# Patient Record
Sex: Male | Born: 1950 | Race: White | Hispanic: No | Marital: Married | Smoking: Never smoker
Health system: Southern US, Community
[De-identification: ages and names within clinical notes are randomized; demographics above are authoritative.]

## PROBLEM LIST (undated history)

## (undated) HISTORY — PX: EYE SURGERY: SHX253

---

## 2020-06-22 DIAGNOSIS — G20 Parkinson's disease: Secondary | ICD-10-CM

## 2020-06-22 HISTORY — DX: Parkinson's disease: G20

## 2020-09-28 ENCOUNTER — Encounter (HOSPITAL_COMMUNITY): Payer: Self-pay | Admitting: Emergency Medicine

## 2020-09-28 ENCOUNTER — Emergency Department (HOSPITAL_COMMUNITY)
Admission: EM | Admit: 2020-09-28 | Discharge: 2020-09-28 | Disposition: A | Discharge status: Home / Self Care | Payer: Medicare (Managed Care) | Attending: Emergency Medicine | Admitting: Emergency Medicine

## 2020-09-28 ENCOUNTER — Other Ambulatory Visit: Payer: Self-pay

## 2020-09-28 ENCOUNTER — Emergency Department (HOSPITAL_COMMUNITY): Payer: Medicare (Managed Care)

## 2020-09-28 DIAGNOSIS — R6883 Chills (without fever): Secondary | ICD-10-CM | POA: Insufficient documentation

## 2020-09-28 DIAGNOSIS — Z20822 Contact with and (suspected) exposure to covid-19: Secondary | ICD-10-CM | POA: Diagnosis not present

## 2020-09-28 DIAGNOSIS — G20 Parkinson's disease: Secondary | ICD-10-CM | POA: Insufficient documentation

## 2020-09-28 DIAGNOSIS — R059 Cough, unspecified: Secondary | ICD-10-CM | POA: Insufficient documentation

## 2020-09-28 LAB — COMPREHENSIVE METABOLIC PANEL
ALT: 33 U/L (ref 0–44)
AST: 27 U/L (ref 15–41)
Albumin: 4.1 g/dL (ref 3.5–5.0)
Alkaline Phosphatase: 42 U/L (ref 38–126)
Anion gap: 8 (ref 5–15)
BUN: 22 mg/dL (ref 8–23)
CO2: 28 mmol/L (ref 22–32)
Calcium: 9.5 mg/dL (ref 8.9–10.3)
Chloride: 105 mmol/L (ref 98–111)
Creatinine, Ser: 1.04 mg/dL (ref 0.61–1.24)
GFR, Estimated: >60 mL/min (ref >60)
Glucose, Bld: 111 mg/dL — ABNORMAL HIGH (ref 70–99)
Potassium: 4.5 mmol/L (ref 3.5–5.1)
Sodium: 141 mmol/L (ref 135–145)
Total Bilirubin: 1.1 mg/dL (ref 0.3–1.2)
Total Protein: 6.7 g/dL (ref 6.5–8.1)

## 2020-09-28 LAB — CBC WITH DIFFERENTIAL/PLATELET
Abs Immature Granulocytes: 0.02 10*3/uL (ref 0.00–0.07)
Basophils Absolute: 0 10*3/uL (ref 0.0–0.1)
Basophils Relative: 0 %
Eosinophils Absolute: 0.1 10*3/uL (ref 0.0–0.5)
Eosinophils Relative: 2 %
HCT: 42.9 % (ref 39.0–52.0)
Hemoglobin: 14 g/dL (ref 13.0–17.0)
Immature Granulocytes: 0 %
Lymphocytes Relative: 14 %
Lymphs Abs: 0.7 10*3/uL (ref 0.7–4.0)
MCH: 31.3 pg (ref 26.0–34.0)
MCHC: 32.6 g/dL (ref 30.0–36.0)
MCV: 96 fL (ref 80.0–100.0)
Monocytes Absolute: 0.7 10*3/uL (ref 0.1–1.0)
Monocytes Relative: 14 %
Neutro Abs: 3.3 10*3/uL (ref 1.7–7.7)
Neutrophils Relative %: 70 %
Platelets: 172 10*3/uL (ref 150–400)
RBC: 4.47 MIL/uL (ref 4.22–5.81)
RDW: 12.7 % (ref 11.5–15.5)
WBC: 4.8 10*3/uL (ref 4.0–10.5)
nRBC: 0 % (ref 0.0–0.2)

## 2020-09-28 LAB — RESP PANEL BY RT-PCR (FLU A&B, COVID) ARPGX2
Influenza A by PCR: NEGATIVE
Influenza B by PCR: NEGATIVE
SARS Coronavirus 2 by RT PCR: NEGATIVE

## 2020-09-28 LAB — CBG MONITORING, ED: Glucose-Capillary: 155 mg/dL — ABNORMAL HIGH (ref 70–99)

## 2020-09-28 MED ORDER — LEVOFLOXACIN 500 MG PO TABS: 500.0000 mg | ORAL_TABLET | Freq: Every day | ORAL | 0 refills | Status: AC

## 2020-09-28 MED ORDER — SODIUM CHLORIDE 0.9 % IV BOLUS
500.0000 mL | Freq: Once | INTRAVENOUS | Status: AC
  Administered 2020-09-28: 500 mL via INTRAVENOUS

## 2020-09-28 MED ORDER — ONDANSETRON HCL 4 MG/2ML IJ SOLN
4.0000 mg | Freq: Once | INTRAMUSCULAR | Status: AC
  Administered 2020-09-28: 4 mg via INTRAVENOUS
  Filled 2020-09-28: qty 2

## 2020-09-28 NOTE — ED Triage Notes (Signed)
Pt c/o of sweating, chills, back pain, headache, nausea and productive cough x 2 days

## 2020-09-28 NOTE — Discharge Instructions (Addendum)
Take Tylenol for fevers and chills drink plenty of fluids and follow-up with your family doctor next week for recheck

## 2020-09-28 NOTE — ED Provider Notes (Signed)
[facility] EMERGENCY DEPARTMENT Provider Note   CSN: [id number] Arrival date & time: 09/28/20  1046     History Chief Complaint  Patient presents with  . Chills    Wayne Solis is a 70 y.o. male.  Patient complains of a cough and some chills for couple days.  Mostly dry cough with some production  The history is provided by the patient. No language interpreter was used.  Cough Cough characteristics:  Productive Sputum characteristics:  Nondescript Severity:  Mild Onset quality:  Sudden Timing:  Constant Progression:  Waxing and waning Chronicity:  New Smoker: no   Context: not animal exposure   Relieved by:  Nothing Associated symptoms: no chest pain, no eye discharge, no headaches and no rash        Past Medical History:  Diagnosis Date  . Parkinson disease (HCC) 2022    There are no problems to display for this patient.   Past Surgical History:  Procedure Laterality Date  . EYE SURGERY         History reviewed. No pertinent family history.  Social History   Tobacco Use  . Smoking status: Never Smoker  . Smokeless tobacco: Never Used  Substance Use Topics  . Alcohol use: Yes  . Drug use: Never    Home Medications Prior to Admission medications   Medication Sig Start Date End Date Taking? Authorizing Provider  levofloxacin (LEVAQUIN) 500 MG tablet Take 1 tablet (500 mg total) by mouth daily. 09/28/20  Yes Bethann Berkshire, MD    Allergies    Patient has no allergy information on record.  Review of Systems   Review of Systems  Constitutional: Negative for appetite change and fatigue.  HENT: Negative for congestion, ear discharge and sinus pressure.   Eyes: Negative for discharge.  Respiratory: Positive for cough.   Cardiovascular: Negative for chest pain.  Gastrointestinal: Negative for abdominal pain and diarrhea.  Genitourinary: Negative for frequency and hematuria.  Musculoskeletal: Negative for back pain.  Skin: Negative for rash.   Neurological: Negative for seizures and headaches.  Psychiatric/Behavioral: Negative for hallucinations.    Physical Exam Updated Vital Signs BP 134/79   Pulse 73   Temp 98.7 F (37.1 C) (Oral)   Resp (!) 30   Ht 6\' 2"  (1.88 m)   Wt 113.4 kg   SpO2 93%   BMI 32.10 kg/m   Physical Exam Vitals and nursing note reviewed.  Constitutional:      Appearance: He is well-developed.  HENT:     Head: Normocephalic.     Nose: Nose normal.  Eyes:     General: No scleral icterus.    Conjunctiva/sclera: Conjunctivae normal.  Neck:     Thyroid: No thyromegaly.  Cardiovascular:     Rate and Rhythm: Normal rate and regular rhythm.     Heart sounds: No murmur heard. No friction rub. No gallop.   Pulmonary:     Breath sounds: No stridor. No wheezing or rales.  Chest:     Chest wall: No tenderness.  Abdominal:     General: There is no distension.     Tenderness: There is no abdominal tenderness. There is no rebound.  Musculoskeletal:        General: Normal range of motion.     Cervical back: Neck supple.  Lymphadenopathy:     Cervical: No cervical adenopathy.  Skin:    Findings: No erythema or rash.  Neurological:     Mental Status: He is oriented to person,  place, and time.     Motor: No abnormal muscle tone.     Coordination: Coordination normal.  Psychiatric:        Behavior: Behavior normal.     ED Results / Procedures / Treatments   Labs (all labs ordered are listed, but only abnormal results are displayed) Labs Reviewed  COMPREHENSIVE METABOLIC PANEL - Abnormal; Notable for the following components:      Result Value   Glucose, Bld 111 (*)    All other components within normal limits  CBG MONITORING, ED - Abnormal; Notable for the following components:   Glucose-Capillary 155 (*)    All other components within normal limits  RESP PANEL BY RT-PCR (FLU A&B, COVID) [id number]  CBC WITH DIFFERENTIAL/PLATELET    EKG None  Radiology DG Chest Port 1 View  Result  Date: 09/28/2020 CLINICAL DATA:  Productive cough sweating in chills for 2 days. EXAM: PORTABLE CHEST 1 VIEW COMPARISON:  None. FINDINGS: Mildly enlarged cardiac silhouette. Mediastinal contours appear intact. There is no evidence of focal airspace consolidation, pleural effusion or pneumothorax. Osseous structures are without acute abnormality. Soft tissues are grossly normal. IMPRESSION: 1. Mildly enlarged cardiac silhouette. 2. No evidence of lobar consolidation. Electronically Signed   By: Ted Mcalpine M.D.   On: 09/28/2020 12:38    Procedures Procedures   Medications Ordered in ED Medications  sodium chloride 0.9 % bolus 500 mL (0 mLs Intravenous Stopped 09/28/20 1304)  ondansetron (ZOFRAN) injection 4 mg (4 mg Intravenous Given 09/28/20 1211)    ED Course  I have reviewed the triage vital signs and the nursing notes.  Pertinent labs & imaging results that were available during my care of the patient were reviewed by me and considered in my medical decision making (see chart for details).    MDM Rules/Calculators/A&P                          Patient with a cough and chills.  Labs unremarkable.  Patient will be treated with antibiotic for possible respiratory infection and follow-up with his MD next week Final Clinical Impression(s) / ED Diagnoses Final diagnoses:  Cough    Rx / DC Orders ED Discharge Orders         Ordered    levofloxacin (LEVAQUIN) 500 MG tablet  Daily        09/28/20 1347           Bethann Berkshire, MD 09/28/20 1350

## 2022-05-07 IMAGING — DX DG CHEST 1V PORT
1 series · 2 of 2 positions shown · non-contrast
Comparison: None.

CLINICAL DATA: Productive cough sweating in chills for 2 days.

EXAM:
PORTABLE CHEST 1 VIEW

[Series 1: chest ap · 0.14mm/px · 2 of 2 slices shown]
[im 1/2]
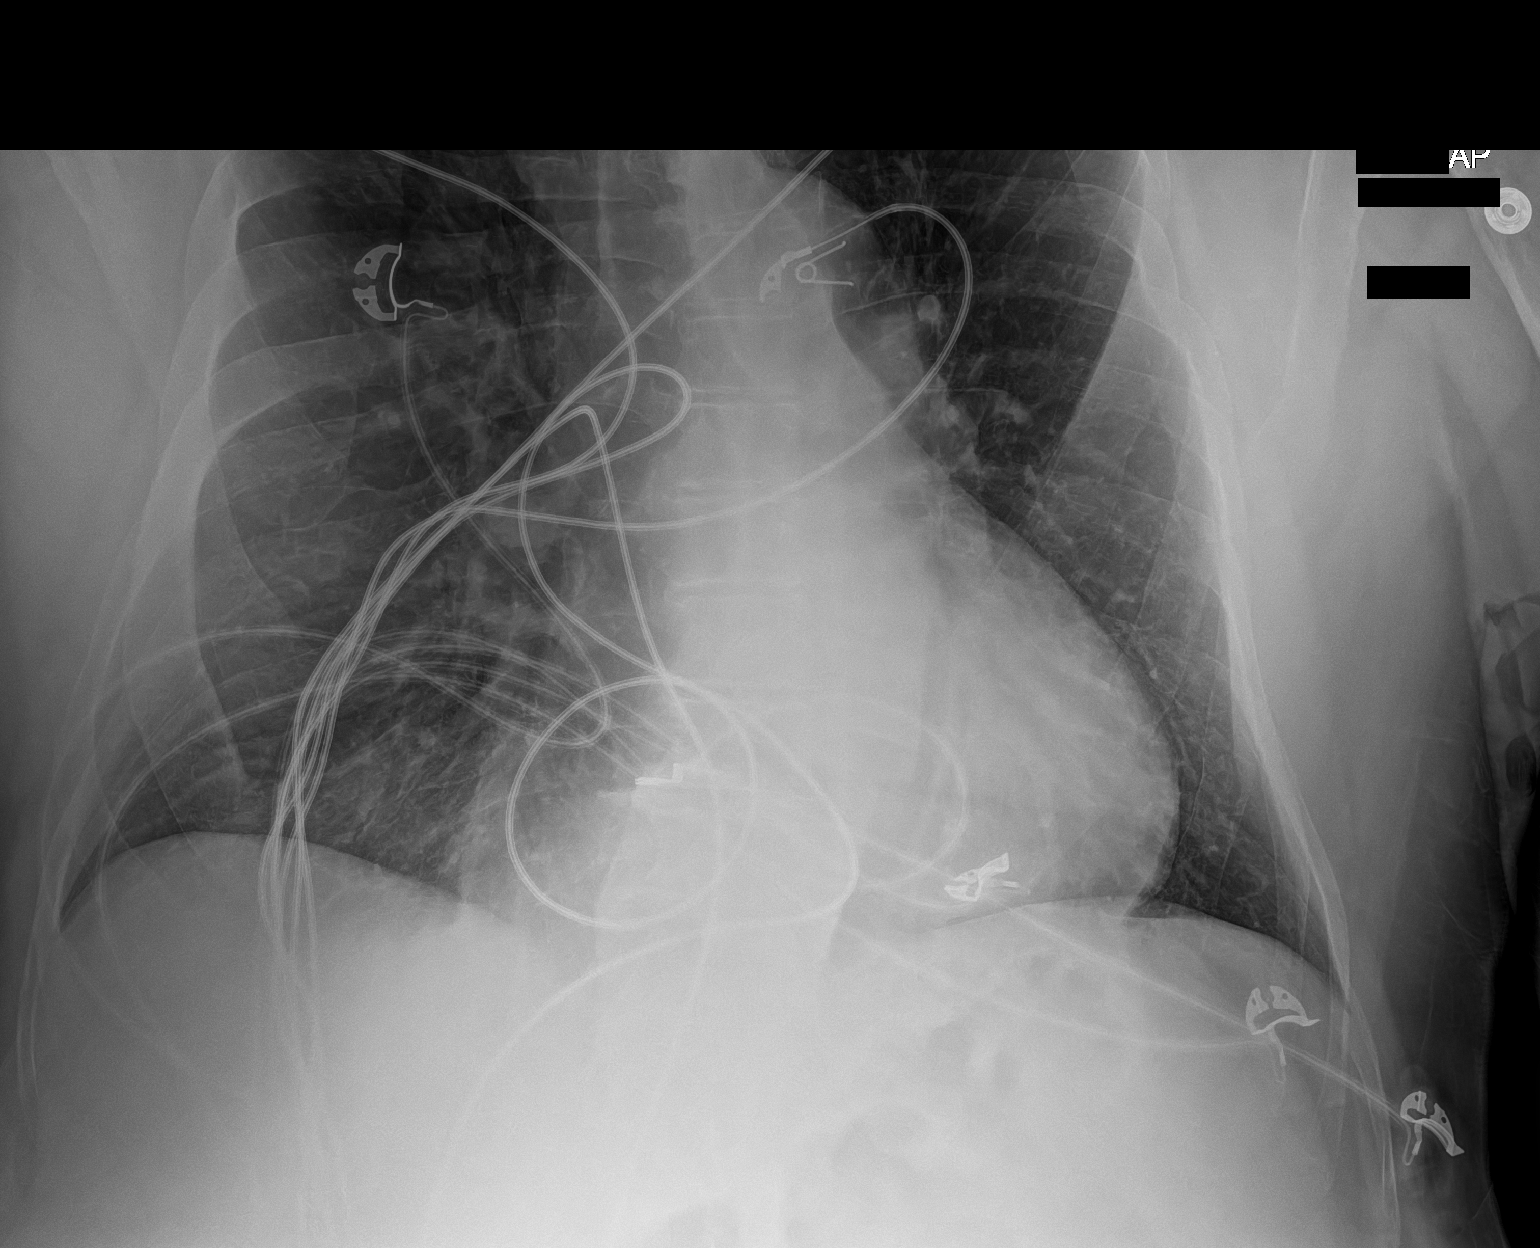
[im 2/2]
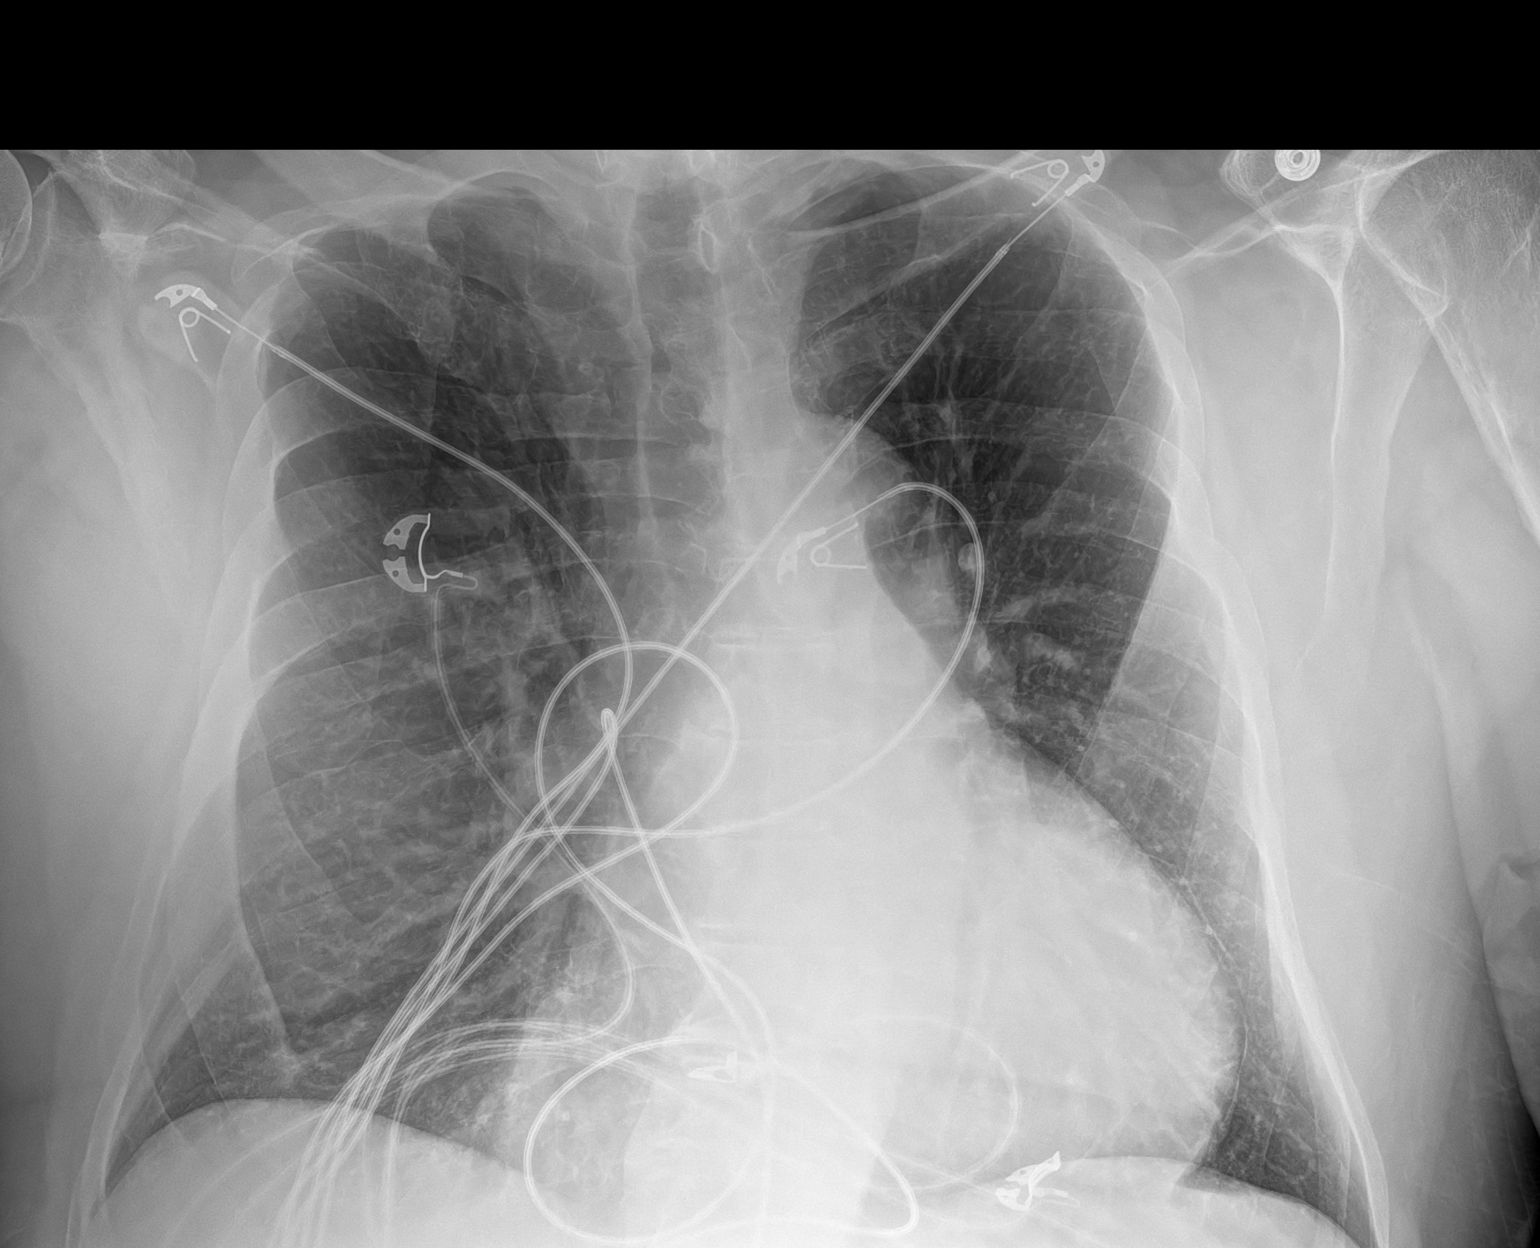

[2 of 2 positions shown; findings below may reference images not displayed]

FINDINGS: Mildly enlarged cardiac silhouette. Mediastinal contours appear
intact.

There is no evidence of focal airspace consolidation, pleural
effusion or pneumothorax.

Osseous structures are without acute abnormality. Soft tissues are
grossly normal.
IMPRESSION: 1. Mildly enlarged cardiac silhouette.
2. No evidence of lobar consolidation.
